# Patient Record
Sex: Male | Born: 2005 | Hispanic: No | Marital: Single | Smoking: Never smoker
Health system: Southern US, Community
[De-identification: ages and names within clinical notes are randomized; demographics above are authoritative.]

## PROBLEM LIST (undated history)

## (undated) DIAGNOSIS — F419 Anxiety disorder, unspecified: Secondary | ICD-10-CM

## (undated) DIAGNOSIS — F32A Depression, unspecified: Secondary | ICD-10-CM

## (undated) DIAGNOSIS — R479 Unspecified speech disturbances: Secondary | ICD-10-CM

## (undated) HISTORY — PX: TONSILLECTOMY: SUR1361

## (undated) HISTORY — DX: Anxiety disorder, unspecified: F41.9

## (undated) HISTORY — DX: Unspecified speech disturbances: R47.9

## (undated) HISTORY — PX: TYMPANOSTOMY TUBE PLACEMENT: SHX32

## (undated) HISTORY — DX: Depression, unspecified: F32.A

---
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
## 2024-02-28 DIAGNOSIS — S66301A Unspecified injury of extensor muscle, fascia and tendon of left index finger at wrist and hand level, initial encounter: Secondary | ICD-10-CM | POA: Diagnosis not present

## 2024-02-28 DIAGNOSIS — S66304A Unspecified injury of extensor muscle, fascia and tendon of right ring finger at wrist and hand level, initial encounter: Secondary | ICD-10-CM | POA: Diagnosis not present

## 2024-02-28 DIAGNOSIS — Z7722 Contact with and (suspected) exposure to environmental tobacco smoke (acute) (chronic): Secondary | ICD-10-CM | POA: Diagnosis not present

## 2024-02-28 DIAGNOSIS — S6991XA Unspecified injury of right wrist, hand and finger(s), initial encounter: Secondary | ICD-10-CM | POA: Diagnosis not present

## 2024-02-28 DIAGNOSIS — X58XXXA Exposure to other specified factors, initial encounter: Secondary | ICD-10-CM | POA: Diagnosis not present

## 2024-02-28 DIAGNOSIS — M7989 Other specified soft tissue disorders: Secondary | ICD-10-CM | POA: Diagnosis not present

## 2024-06-26 ENCOUNTER — Telehealth: Payer: Self-pay | Admitting: Family Medicine

## 2024-06-26 NOTE — Telephone Encounter (Signed)
 Needs to be seen, it has been over 6 months

## 2024-06-26 NOTE — Telephone Encounter (Signed)
 Copied from CRM [id number]. Topic: Clinical - Medical Advice >> Jun 26, 2024  1:18 PM Leonette P wrote: Reason for CRM: mother called saying patient is having the fungas flair up on his skin and before there was a pill he could take.  She is wanting to know if it can be sent to his pharmacy [facility].   CB#  [phone]

## 2024-06-26 NOTE — Telephone Encounter (Signed)
 Last seen by Tiffany 09/2023 for Tinea Versicolor. Ok to send refills of Diflucan  or do you want to see in office?

## 2024-06-26 NOTE — Telephone Encounter (Signed)
 Appt made with mom for 8/18 at 3:55pm.

## 2024-07-03 ENCOUNTER — Ambulatory Visit (INDEPENDENT_AMBULATORY_CARE_PROVIDER_SITE_OTHER): Admitting: Family Medicine

## 2024-07-03 ENCOUNTER — Encounter: Payer: Self-pay | Admitting: Family Medicine

## 2024-07-03 DIAGNOSIS — B360 Pityriasis versicolor: Secondary | ICD-10-CM

## 2024-07-03 MED ORDER — TERBINAFINE HCL 250 MG PO TABS: 250.0000 mg | ORAL_TABLET | ORAL | 0 refills | Status: AC

## 2024-07-03 MED ORDER — TERBINAFINE HCL 1 % EX CREA: 1.0000 | TOPICAL_CREAM | Freq: Two times a day (BID) | CUTANEOUS | 2 refills | Status: AC

## 2024-07-03 NOTE — Progress Notes (Signed)
 BP 122/77   Pulse 65   Ht 5' 11 (1.803 m)   Wt 249 lb (112.9 kg)   SpO2 98%   BMI 34.73 kg/m    Subjective:   Patient ID: Shawn Charles, male    DOB: [birth year], 18 y.o.   MRN: [id number]  HPI: Shawn Charles is a 17 y.o. male presenting on 07/03/2024 for Medical Management of Chronic Issues and tinea versicolor   Discussed the use of AI scribe software for clinical note transcription with the patient, who gave verbal consent to proceed.  History of Present Illness   Shawn Charles is an 18 year old male who presents with recurrent tinea versicolor.  He has been experiencing recurrent tinea versicolor since his last visit. Initially, there was improvement with the prescribed cream, but inconsistent application led to a recurrence of the spots. The affected areas include his back, arms, belly, and upper thighs, with itching primarily on his back.  He has been applying the cream to all affected areas, but the supply was insufficient, lasting only about a week. His mother assists in applying the cream to areas he cannot reach, such as the top part of his back.  Itching is reported on his back, but not on his arms.          Relevant past medical, surgical, family and social history reviewed and updated as indicated. Interim medical history since our last visit reviewed. Allergies and medications reviewed and updated.  Review of Systems  Constitutional:  Negative for chills and fever.  Skin:  Positive for color change and rash. Negative for wound.    Per HPI unless specifically indicated above   Allergies as of 07/03/2024   No Known Allergies      Medication List        Accurate as of July 03, 2024  4:31 PM. If you have any questions, ask your nurse or doctor.          terbinafine  1 % cream Commonly known as: LAMISIL  Apply 1 Application topically 2 (two) times daily.   terbinafine  250 MG tablet Commonly known as: LAMISIL  Take 1 tablet (250 mg total) by  mouth once a week. Started by: Fonda LABOR Maybell Misenheimer         Objective:   BP 122/77   Pulse 65   Ht 5' 11 (1.803 m)   Wt 249 lb (112.9 kg)   SpO2 98%   BMI 34.73 kg/m   Wt Readings from Last 3 Encounters:  07/03/24 249 lb (112.9 kg) (>99%, Z= 2.42)*  09/27/23 (!) 231 lb 4 oz (104.9 kg) (99%, Z= 2.20)*  05/03/23 (!) 239 lb (108.4 kg) (>99%, Z= 2.38)*   * Growth percentiles are based on CDC (Boys, 2-20 Years) data.    Physical Exam Physical Exam   SKIN: Tinea versicolor on arm, back, side, and abdomen.         Assessment & Plan:   Problem List Items Addressed This Visit   None Visit Diagnoses       Tinea versicolor       Relevant Medications   terbinafine  (LAMISIL ) 250 MG tablet   terbinafine  (LAMISIL ) 1 % cream           Tinea versicolor involving trunk and upper thighs Recurrent tinea versicolor with extensive involvement. Previous topical treatment showed partial improvement. Recurrence due to inconsistent application. Combination of topical and oral antifungal treatment warranted. - Prescribed oral antifungal medication: one dose now, another in one week. -  Continue topical antifungal cream for at least one month post-lesion resolution. - Increase quantity of topical antifungal cream for extensive coverage. - Ensure assistance with cream application on hard-to-reach areas.          Follow up plan: Return if symptoms worsen or fail to improve, for Needs to schedule physical exam sometime this year.  Counseling provided for all of the vaccine components No orders of the defined types were placed in this encounter.   Fonda Levins, MD [facility] 07/03/2024, 4:31 PM
